# Patient Record
Sex: Female | Born: 1965 | Race: White | Hispanic: Yes | Marital: Married | Smoking: Never smoker
Health system: Southern US, Community
[De-identification: ages and names within clinical notes are randomized; demographics above are authoritative.]

---

## 2001-05-29 HISTORY — PX: ABDOMINAL HYSTERECTOMY: SHX81

## 2004-12-29 ENCOUNTER — Ambulatory Visit: Payer: Self-pay | Admitting: Family Medicine

## 2005-01-03 ENCOUNTER — Ambulatory Visit: Payer: Self-pay | Admitting: Obstetrics and Gynecology

## 2005-03-08 ENCOUNTER — Ambulatory Visit: Payer: Self-pay | Admitting: Unknown Physician Specialty

## 2005-03-14 ENCOUNTER — Ambulatory Visit: Payer: Self-pay | Admitting: Unknown Physician Specialty

## 2006-01-04 ENCOUNTER — Ambulatory Visit: Payer: Self-pay | Admitting: Obstetrics and Gynecology

## 2006-08-06 ENCOUNTER — Ambulatory Visit: Payer: Self-pay | Admitting: Gastroenterology

## 2006-10-25 ENCOUNTER — Ambulatory Visit: Payer: Self-pay | Admitting: Gastroenterology

## 2007-01-07 ENCOUNTER — Ambulatory Visit: Payer: Self-pay | Admitting: Obstetrics and Gynecology

## 2007-07-12 ENCOUNTER — Ambulatory Visit: Payer: Self-pay | Admitting: Obstetrics and Gynecology

## 2007-07-19 ENCOUNTER — Ambulatory Visit: Payer: Self-pay | Admitting: Obstetrics and Gynecology

## 2008-01-14 ENCOUNTER — Ambulatory Visit: Payer: Self-pay | Admitting: Obstetrics and Gynecology

## 2008-01-23 ENCOUNTER — Ambulatory Visit: Payer: Self-pay | Admitting: Gastroenterology

## 2008-05-29 HISTORY — PX: CHOLECYSTECTOMY: SHX55

## 2009-02-04 ENCOUNTER — Ambulatory Visit: Payer: Self-pay | Admitting: Gastroenterology

## 2009-02-04 LAB — HM COLONOSCOPY

## 2009-02-05 ENCOUNTER — Ambulatory Visit: Payer: Self-pay | Admitting: Surgery

## 2009-02-07 IMAGING — NM NUCLEAR MEDICINE HEPATOHBILIARY INCLUDE GB
1 series · 7 of 7 positions shown · non-contrast
Comparison: none

REASON FOR EXAM: abdominal pain   eval gallstones
COMMENTS:

[Series 1000: gallbladder statics · 4.80mm/px · 7 of 7 slices shown]
[im 1/7]
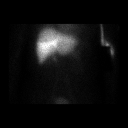
[im 2/7]
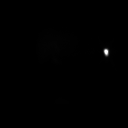
[im 3/7]
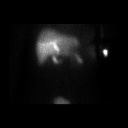
[im 4/7]
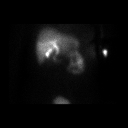
[im 5/7]
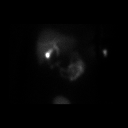
[im 6/7]
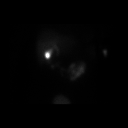
[im 7/7]
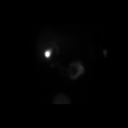

[7 of 7 positions shown; findings below may reference images not displayed]

PROCEDURE:     NM  - NM HEPATOBILIARY IMAGE  - January 23, 2008  [DATE]

RESULT:     Following intravenous administration of 8.43 mCi Tc 99m
Choletec, there is noted prompt visualization of tracer activity in the
liver at 10 minutes. Tracer activity is visualized in the gallbladder,
common duct and proximal small bowel at 30 minutes.
IMPRESSION: 1.     Normal hepatobiliary scan.

## 2009-02-18 ENCOUNTER — Ambulatory Visit: Payer: Self-pay | Admitting: Surgery

## 2009-03-03 ENCOUNTER — Ambulatory Visit: Payer: Self-pay | Admitting: Surgery

## 2009-03-09 ENCOUNTER — Ambulatory Visit: Payer: Self-pay | Admitting: Obstetrics and Gynecology

## 2010-03-16 ENCOUNTER — Ambulatory Visit: Payer: Self-pay | Admitting: Obstetrics and Gynecology

## 2010-07-04 ENCOUNTER — Ambulatory Visit: Payer: Self-pay | Admitting: Family Medicine

## 2011-12-18 IMAGING — MG MAM DGTL SCREENING MAMMO W/CAD
1 series · 4 of 4 positions shown · non-contrast
Comparison: 03/09/2009 and 01/14/2008.

REASON FOR EXAM: SCR
COMMENTS:

PROCEDURE:     MAM - MAM DGTL SCREENING MAMMO W/CAD  - March 16, 2010  [DATE]
RESULT:

[Series 5507: R CC · right · 4 of 4 slices shown]
[im 1/4]
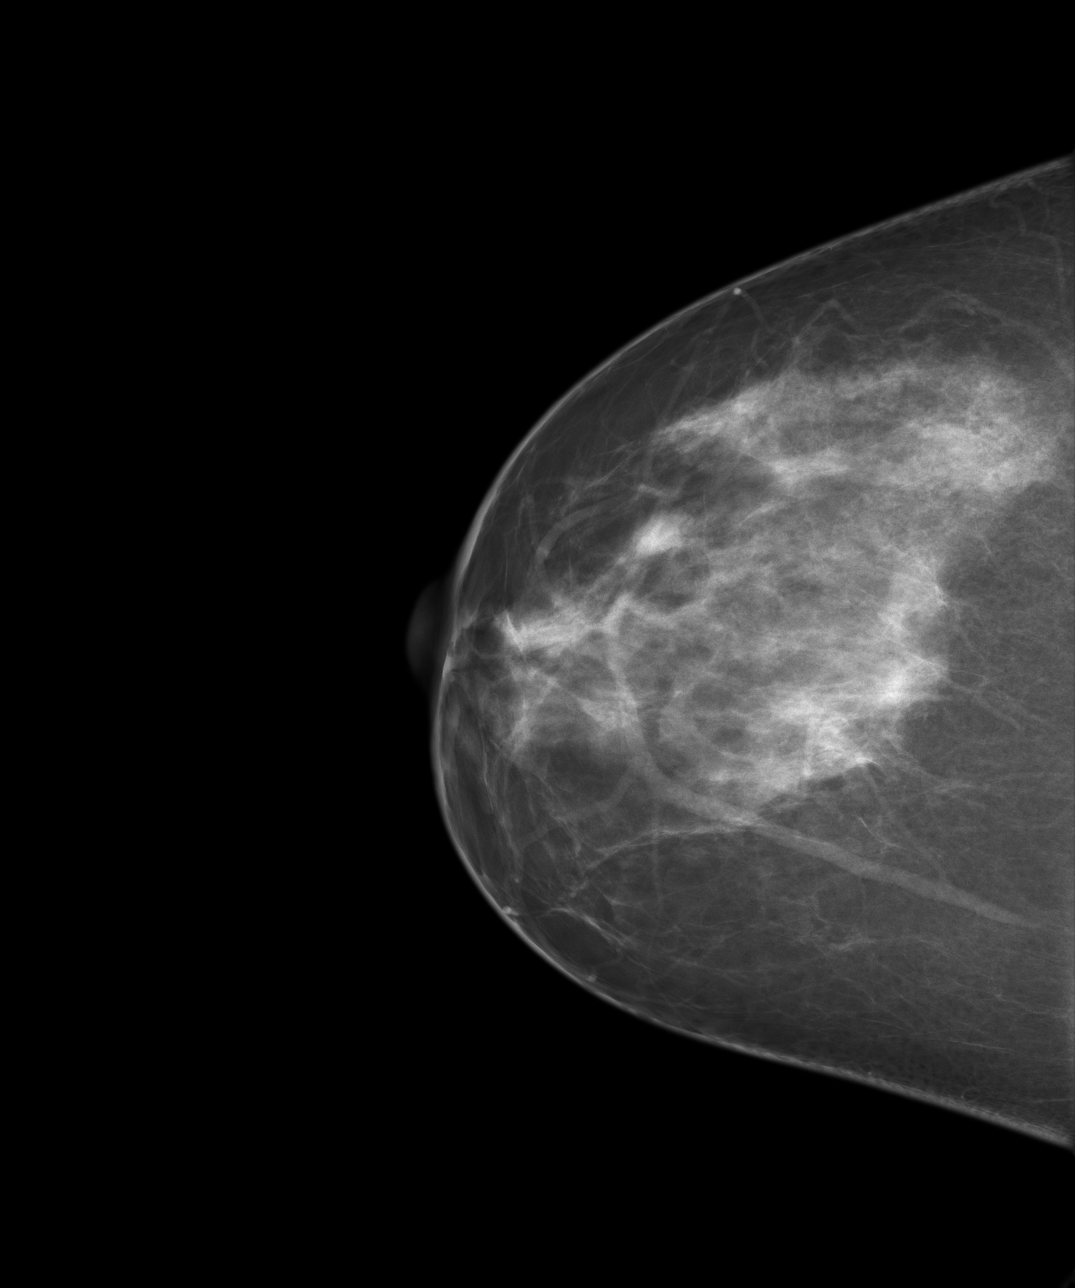
[im 2/4]
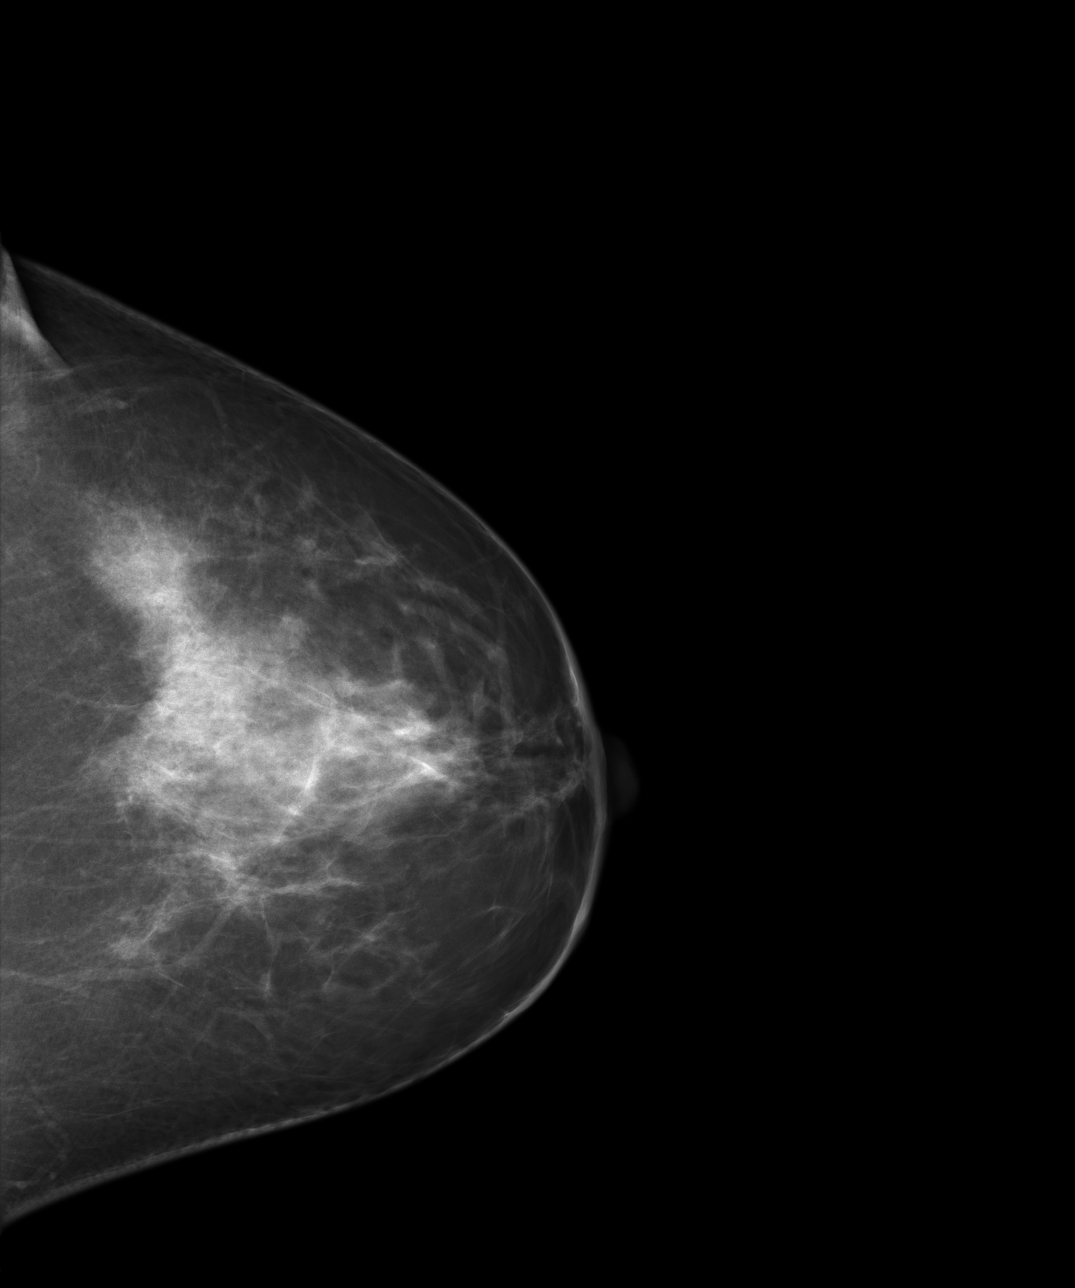
[im 3/4]
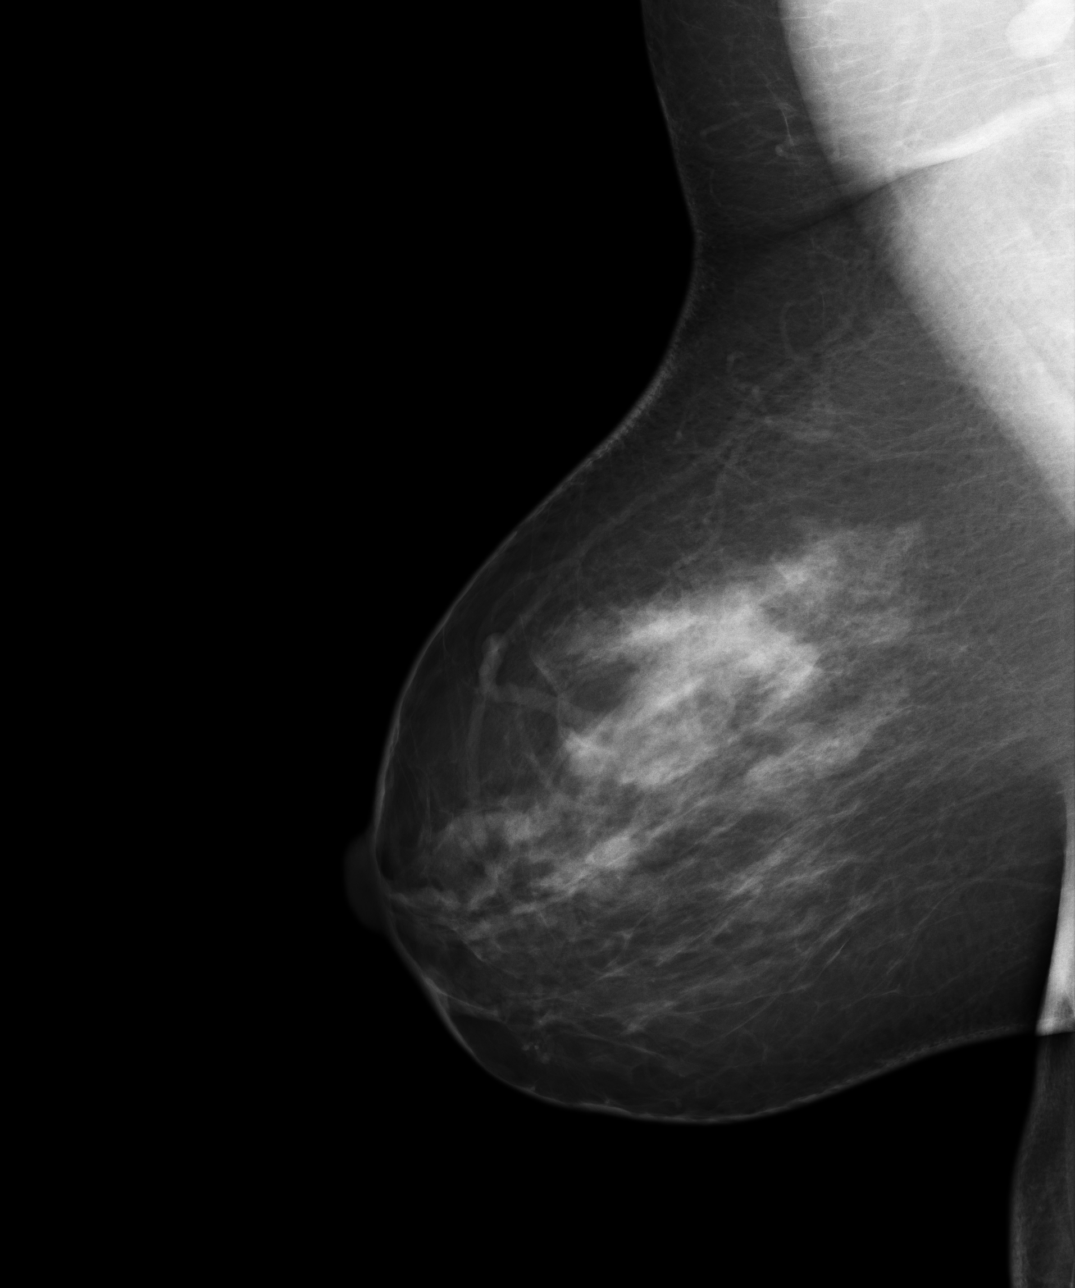
[im 4/4]
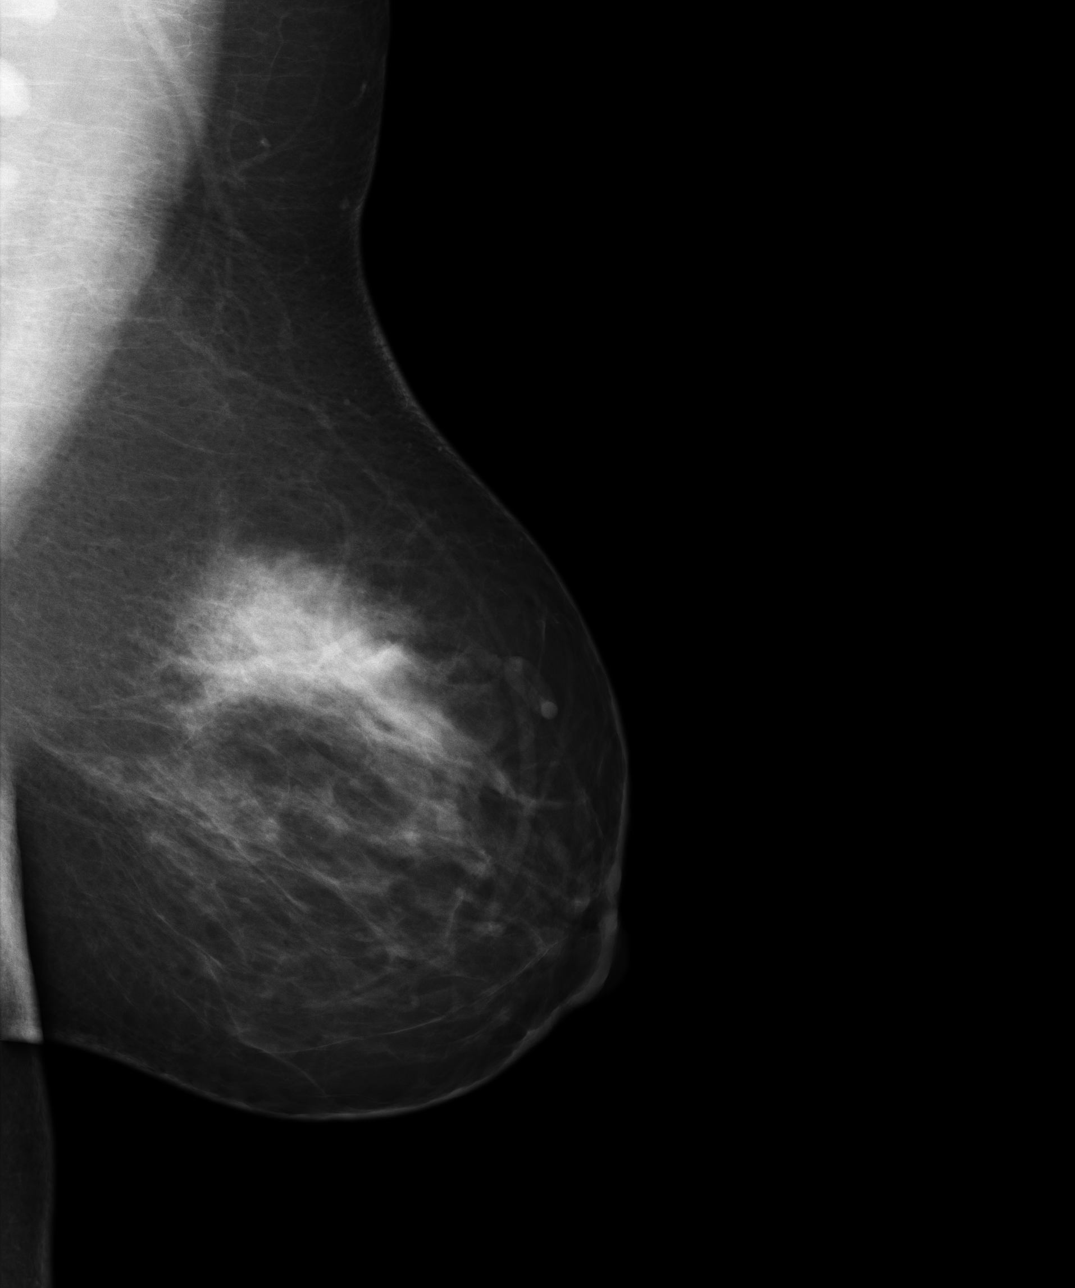

[4 of 4 positions shown; findings below may reference images not displayed]

FINDINGS: The breast tissue is heterogenously dense.  No new or suspicious
masses or calcifications are identified.   No areas of architectural
distortion.
IMPRESSION: 1.     BI-RADS: Category 1-Negative.
2.     Continued annual screening mammography is recommended.

A negative mammogram report does not preclude biopsy or other evaluation of
a clinically palpable or otherwise suspicious mass or lesion. Breast cancer
may not be detected by mammography in up to 10% of cases.

## 2012-10-25 ENCOUNTER — Ambulatory Visit: Payer: Self-pay | Admitting: Obstetrics and Gynecology

## 2014-07-29 IMAGING — MG MAM DGTL SCRN MAM NO ORDER W/CAD
1 series · 4 of 4 positions shown · non-contrast
Comparison: none

REASON FOR EXAM: SCR MAMMO NO ORDER
COMMENTS:

PROCEDURE:     MAM - MAM DGTL SCRN MAM NO ORDER W/CAD  - October 25, 2012  [DATE]
RESULT:
Comparisons: 03/16/2010, 03/09/2009, and 01/14/2008.

[R CC · right · 4 of 4 slices shown]
[im 1/4]
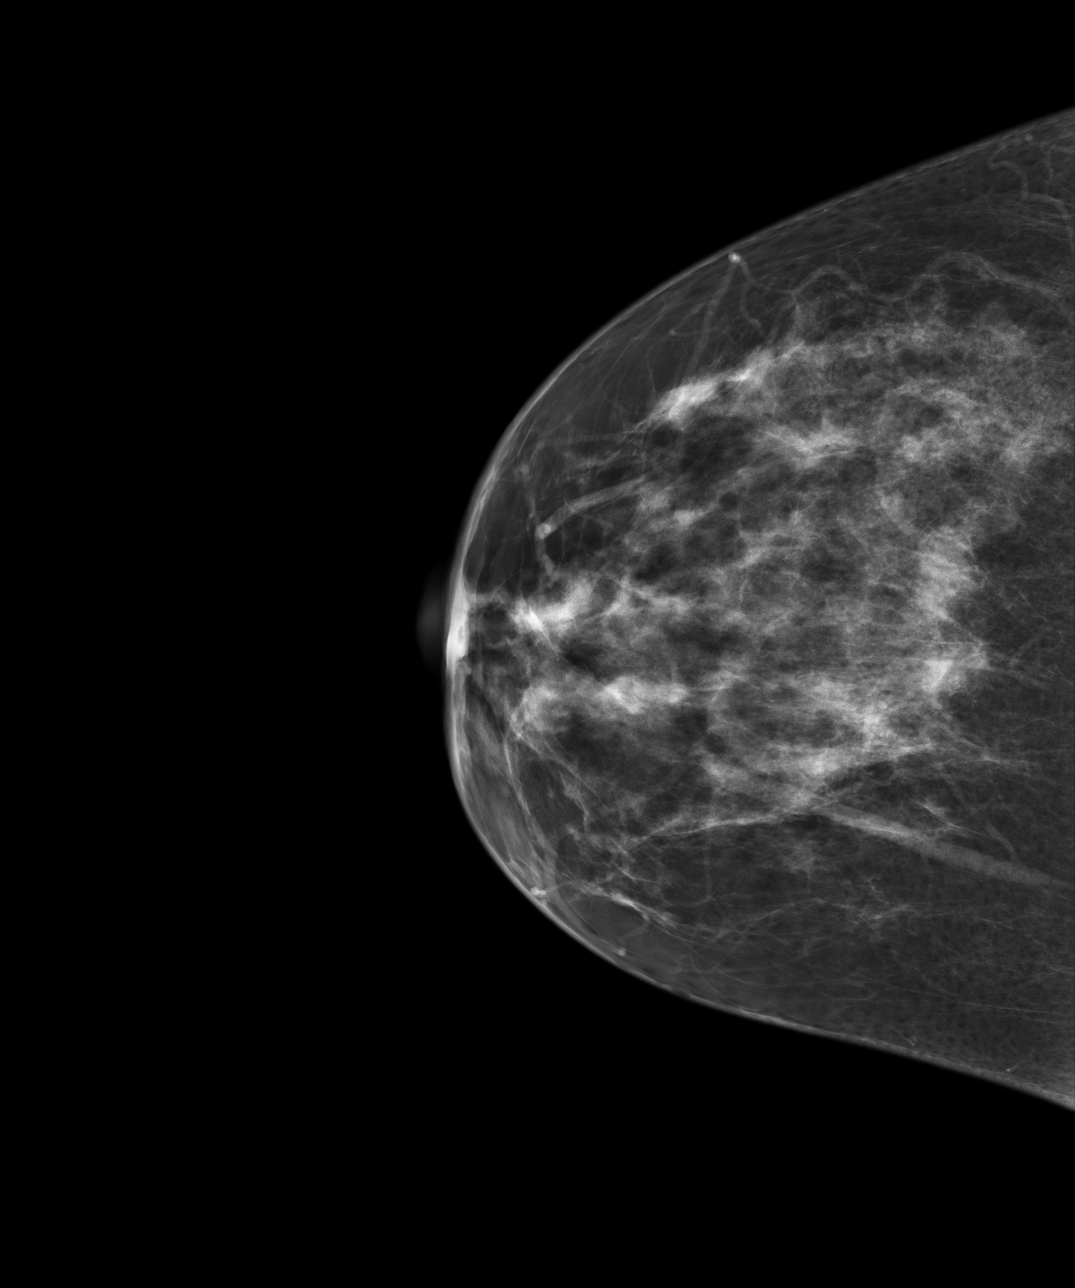
[im 2/4]
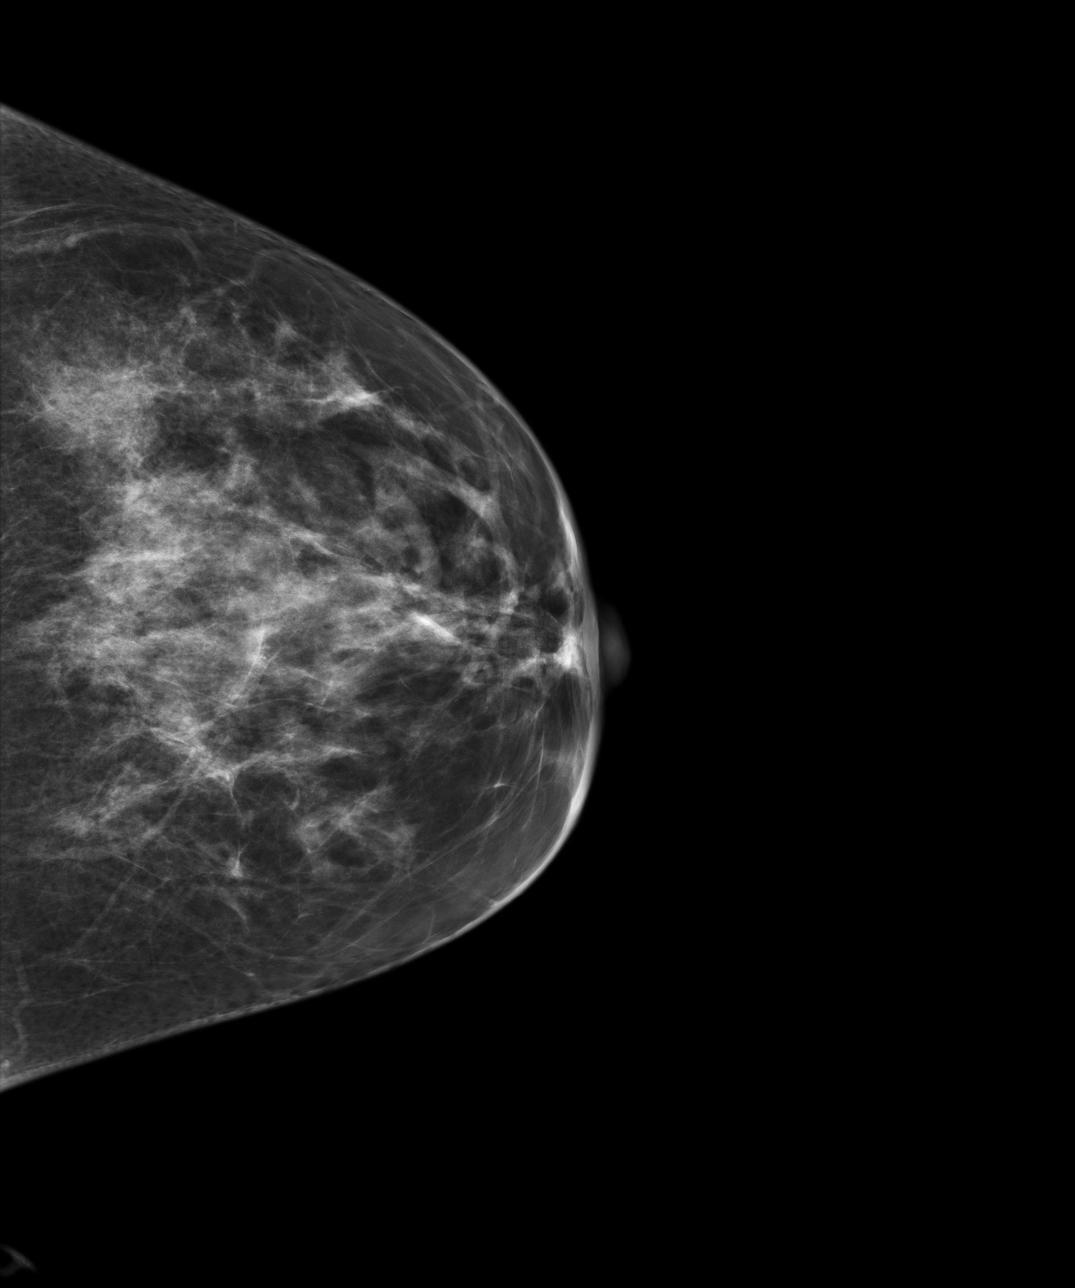
[im 3/4]
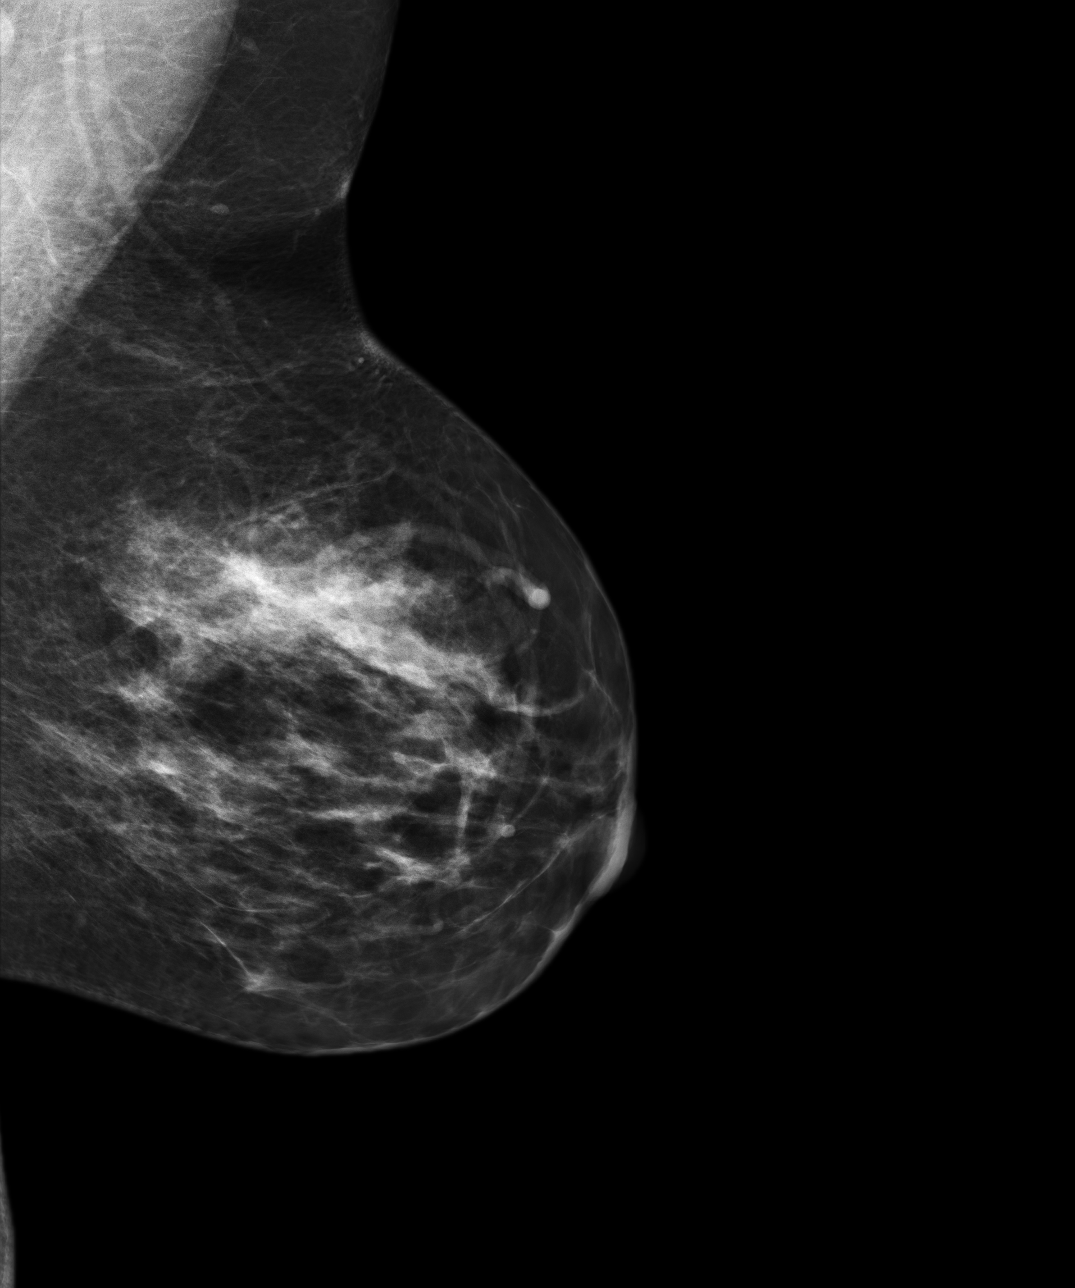
[im 4/4]
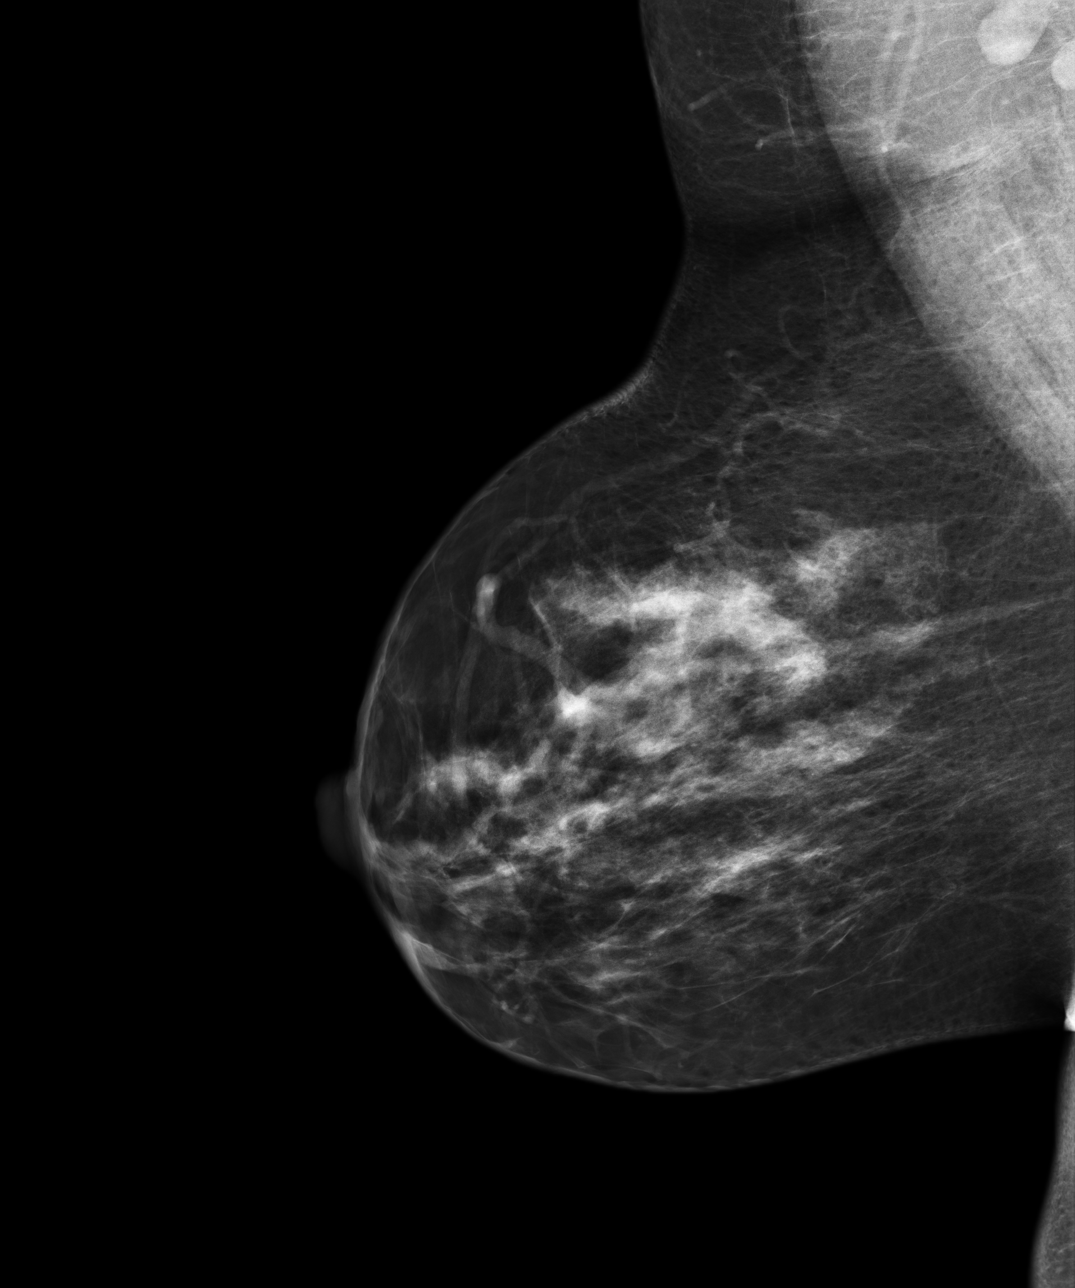

[4 of 4 positions shown; findings below may reference images not displayed]

FINDINGS: The breast tissue is heterogeneously dense, which may lower the sensitivity
of mammography. No suspicious masses or calcifications are identified. No
areas of architectural distortion.
IMPRESSION: BI-RADS: Category 1  Negative

BREAST COMPOSITION: The breast composition is HETEROGENEOUSLY DENSE
(glandular tissue is 51-75%) This may decrease the sensitivity of
mammography.

Recommend continued annual screening mammography.

A NEGATIVE MAMMOGRAM REPORT DOES NOT PRECLUDE BIOPSY OR OTHER EVALUATION OF
A CLINICALLY PALPABLE OR OTHERWISE SUSPICIOUS MASS OR LESION. BREAST CANCER
MAY NOT BE DETECTED BY MAMMOGRAPHY IN UP TO 10% OF CASES.

[REDACTED]

## 2015-07-27 ENCOUNTER — Other Ambulatory Visit: Payer: Self-pay | Admitting: Family Medicine

## 2015-07-27 DIAGNOSIS — F419 Anxiety disorder, unspecified: Secondary | ICD-10-CM

## 2015-07-27 NOTE — Telephone Encounter (Signed)
Last OV 09/2014  Thanks,   -Laura  

## 2015-07-27 NOTE — Telephone Encounter (Signed)
Printed, please fax or call in to pharmacy. Thank you.   

## 2015-07-30 ENCOUNTER — Other Ambulatory Visit: Payer: Self-pay | Admitting: Family Medicine

## 2015-07-30 DIAGNOSIS — F419 Anxiety disorder, unspecified: Secondary | ICD-10-CM

## 2015-09-15 ENCOUNTER — Ambulatory Visit (INDEPENDENT_AMBULATORY_CARE_PROVIDER_SITE_OTHER): Payer: Self-pay | Admitting: Family Medicine

## 2015-09-15 ENCOUNTER — Encounter: Payer: Self-pay | Admitting: Family Medicine

## 2015-09-15 VITALS — BP 126/82 | HR 84 | Temp 98.5°F | Resp 16 | Wt 194.0 lb

## 2015-09-15 DIAGNOSIS — K589 Irritable bowel syndrome without diarrhea: Secondary | ICD-10-CM | POA: Insufficient documentation

## 2015-09-15 DIAGNOSIS — I10 Essential (primary) hypertension: Secondary | ICD-10-CM | POA: Insufficient documentation

## 2015-09-15 DIAGNOSIS — J309 Allergic rhinitis, unspecified: Secondary | ICD-10-CM

## 2015-09-15 DIAGNOSIS — F32A Depression, unspecified: Secondary | ICD-10-CM | POA: Insufficient documentation

## 2015-09-15 DIAGNOSIS — F329 Major depressive disorder, single episode, unspecified: Secondary | ICD-10-CM | POA: Insufficient documentation

## 2015-09-15 DIAGNOSIS — K649 Unspecified hemorrhoids: Secondary | ICD-10-CM | POA: Insufficient documentation

## 2015-09-15 DIAGNOSIS — K2970 Gastritis, unspecified, without bleeding: Secondary | ICD-10-CM | POA: Insufficient documentation

## 2015-09-15 DIAGNOSIS — R05 Cough: Secondary | ICD-10-CM

## 2015-09-15 DIAGNOSIS — G4730 Sleep apnea, unspecified: Secondary | ICD-10-CM | POA: Insufficient documentation

## 2015-09-15 DIAGNOSIS — R609 Edema, unspecified: Secondary | ICD-10-CM | POA: Insufficient documentation

## 2015-09-15 DIAGNOSIS — K449 Diaphragmatic hernia without obstruction or gangrene: Secondary | ICD-10-CM | POA: Insufficient documentation

## 2015-09-15 DIAGNOSIS — R059 Cough, unspecified: Secondary | ICD-10-CM

## 2015-09-15 DIAGNOSIS — M5430 Sciatica, unspecified side: Secondary | ICD-10-CM | POA: Insufficient documentation

## 2015-09-15 DIAGNOSIS — L309 Dermatitis, unspecified: Secondary | ICD-10-CM | POA: Insufficient documentation

## 2015-09-15 DIAGNOSIS — K635 Polyp of colon: Secondary | ICD-10-CM | POA: Insufficient documentation

## 2015-09-15 DIAGNOSIS — R0602 Shortness of breath: Secondary | ICD-10-CM | POA: Insufficient documentation

## 2015-09-15 DIAGNOSIS — E559 Vitamin D deficiency, unspecified: Secondary | ICD-10-CM | POA: Insufficient documentation

## 2015-09-15 DIAGNOSIS — F419 Anxiety disorder, unspecified: Secondary | ICD-10-CM

## 2015-09-15 MED ORDER — ALBUTEROL SULFATE HFA 108 (90 BASE) MCG/ACT IN AERS
2.0000 | INHALATION_SPRAY | Freq: Four times a day (QID) | RESPIRATORY_TRACT | Status: AC | PRN
Start: 2015-09-15 — End: ?

## 2015-09-15 MED ORDER — ALPRAZOLAM 0.5 MG PO TABS: 0.5000 mg | ORAL_TABLET | Freq: Every day | ORAL | Status: DC | PRN

## 2015-09-15 MED ORDER — LEVALBUTEROL HCL 1.25 MG/0.5ML IN NEBU
1.2500 mg | INHALATION_SOLUTION | Freq: Once | RESPIRATORY_TRACT | Status: AC
  Administered 2015-09-15: 1.25 mg via RESPIRATORY_TRACT

## 2015-09-15 MED ORDER — MONTELUKAST SODIUM 10 MG PO TABS: 10.0000 mg | ORAL_TABLET | Freq: Every day | ORAL | Status: AC

## 2015-09-15 NOTE — Progress Notes (Signed)
Subjective:    Patient ID: Adrienne Edwards, female    DOB: [birth year], [age] y.o.   MRN: [id number]  Cough The current episode started more than 1 year ago. The problem has been gradually worsening. The cough is non-productive. Associated symptoms include chest pain, nasal congestion, shortness of breath and wheezing. Pertinent negatives include no chills, ear congestion, ear pain, fever, headaches, heartburn, hemoptysis, myalgias, postnasal drip, rhinorrhea or sore throat. The symptoms are aggravated by dust, pollens and animals (pt reports she was in [place] for a month, without breathing/coughing issues. Pt notices when she comes back here, her sx worsen). Treatments tried: Nasacort, Zyrtec. The treatment provided moderate relief. Her past medical history is significant for environmental allergies.      Review of Systems  Constitutional: Negative for fever and chills.  HENT: Negative for ear pain, postnasal drip, rhinorrhea and sore throat.   Respiratory: Positive for cough, shortness of breath and wheezing. Negative for hemoptysis.   Cardiovascular: Positive for chest pain.  Gastrointestinal: Negative for heartburn.  Musculoskeletal: Negative for myalgias.  Allergic/Immunologic: Positive for environmental allergies.  Neurological: Negative for headaches.   BP 126/82 mmHg  Pulse 84  Temp(Src) 98.5 F (36.9 C) (Oral)  Resp 16  Wt 194 lb (87.998 kg)  SpO2 98%   Patient Active Problem List   Diagnosis Date Noted  . Allergic rhinitis 09/15/2015  . Colon polyp 09/15/2015  . Clinical depression 09/15/2015  . Dermatitis, eczematoid 09/15/2015  . Accumulation of fluid in tissues 09/15/2015  . Gastric catarrh 09/15/2015  . Hemorrhoid 09/15/2015  . 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 Benign hypertension 09/15/2015  . 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 Neuralgia neuritis, sciatic nerve 09/15/2015  . Avitaminosis D 09/15/2015  . Apnea, sleep 09/15/2015  . Breath shortness 09/15/2015   . Anxiety 07/27/2015  . Calculus of gallbladder with cholecystitis and obstruction 01/21/2009   No past medical history on file. Current Outpatient Prescriptions on File Prior to Visit  Medication Sig  . ALPRAZolam (XANAX) 0.5 MG tablet TAKE 1 TABLET BY MOUTH DAILY (Patient not taking: Reported on 09/15/2015)   No current facility-administered medications on file prior to visit.   Allergies  Allergen Reactions  . Codeine   . Hydrocodone-Acetaminophen   . Naproxen    Past Surgical History  Procedure Laterality Date  . Cholecystectomy  2010  . Abdominal hysterectomy  2003   Social History   Social History  . Marital Status: Married    Spouse Name: N/A  . Number of Children: N/A  . Years of Education: N/A   Occupational History  . Not on file.   Social History Main Topics  . Smoking status: Never Smoker   . Smokeless tobacco: Never Used  . Alcohol Use: Yes     Comment: socially  . Drug Use: No  . Sexual Activity: Not on file   Other Topics Concern  . Not on file   Social History Narrative   Family History  Problem Relation Age of Onset  . Heart disease Mother   . Cancer Father     prostate       Objective:   Physical Exam  Constitutional: She appears well-developed and well-nourished.  HENT:  Head: Normocephalic and atraumatic.  Right Ear: External ear normal.  Left Ear: External ear normal.  Nose: Nose normal.  Mouth/Throat: No oropharyngeal exudate.  Cardiovascular: Normal rate, regular rhythm and normal heart sounds.   Pulmonary/Chest: Effort normal and breath sounds normal. No respiratory distress.  Psychiatric:  She has a normal mood and affect. Her behavior is normal.  BP 126/82 mmHg  Pulse 84  Temp(Src) 98.5 F (36.9 C) (Oral)  Resp 16  Wt 194 lb (87.998 kg)  SpO2 98%     Assessment & Plan:  1. Cough Consider AR vs. Asthma. Will start ProAir as below. FU 2 weeks. If using ProAir frequently, may consider asthma as diagnosis. -  levalbuterol (XOPENEX) nebulizer solution 1.25 mg; Take 1.25 mg by nebulization once. - albuterol (PROVENTIL HFA;VENTOLIN HFA) 108 (90 Base) MCG/ACT inhaler; Inhale 2 puffs into the lungs every 6 (six) hours as needed for wheezing or shortness of breath.  Dispense: 1 Inhaler; Refill: 3  2. Allergic rhinitis, unspecified allergic rhinitis type Worsening. Continue Zyrtec and Nasacort. Will add Singulair and ProAir as below. FU 2 weeks. - montelukast (SINGULAIR) 10 MG tablet; Take 1 tablet (10 mg total) by mouth at bedtime.  Dispense: 30 tablet; Refill: 11 - albuterol (PROVENTIL HFA;VENTOLIN HFA) 108 (90 Base) MCG/ACT inhaler; Inhale 2 puffs into the lungs every 6 (six) hours as needed for wheezing or shortness of breath.  Dispense: 1 Inhaler; Refill: 3  3. Anxiety Pt needs refill. Printed. - ALPRAZolam (XANAX) 0.5 MG tablet; Take 1 tablet (0.5 mg total) by mouth daily as needed for anxiety.  Dispense: 30 tablet; Refill: 5   Patient seen and examined by Leo Grosser, MD, and note scribed by Allene Dillon, CMA.  I have reviewed the document for accuracy and completeness and I agree with above. Leo Grosser, MD   Lorie Phenix, MD

## 2015-09-29 ENCOUNTER — Ambulatory Visit (INDEPENDENT_AMBULATORY_CARE_PROVIDER_SITE_OTHER): Payer: Self-pay | Admitting: Family Medicine

## 2015-09-29 ENCOUNTER — Encounter: Payer: Self-pay | Admitting: Family Medicine

## 2015-09-29 VITALS — BP 118/78 | HR 82 | Temp 98.1°F | Resp 16 | Wt 193.0 lb

## 2015-09-29 DIAGNOSIS — M722 Plantar fascial fibromatosis: Secondary | ICD-10-CM

## 2015-09-29 DIAGNOSIS — J4520 Mild intermittent asthma, uncomplicated: Secondary | ICD-10-CM

## 2015-09-29 MED ORDER — FLUTICASONE-SALMETEROL 250-50 MCG/DOSE IN AEPB
1.0000 | INHALATION_SPRAY | Freq: Two times a day (BID) | RESPIRATORY_TRACT | Status: AC
Start: 2015-09-29 — End: ?

## 2015-09-29 NOTE — Patient Instructions (Signed)

## 2015-09-29 NOTE — Progress Notes (Signed)
Subjective:    Patient ID: Adrienne Edwards, female    DOB: 1965-07-14, 50 y.o.   MRN: [id number]  Cough Chronicity: FU from 09/15/2015. PCP was considering AR Vs asthma. Added ProAir and Singulair at LOV. The problem has been gradually improving (pt states she is 70% improved. Has not had to use ProAir this week. Pt reports she notices the cough when she gets "upset"). The cough is non-productive. Associated symptoms include chest pain ("pressure"), headaches and wheezing. Pertinent negatives include no chills, ear congestion, ear pain, fever, heartburn, hemoptysis, nasal congestion, postnasal drip, rhinorrhea, sore throat, shortness of breath or weight loss. She has tried a beta-agonist inhaler and leukotriene antagonists for the symptoms. The treatment provided moderate relief. Her past medical history is significant for environmental allergies.    Foot Pain  Adrienne Edwards is a 50 y.o. female who presents with a bilateral foot pain that has been present since month(s) ago. Pt describes the pain as aching and slightly burning. Denies numbness or tingling. Symptoms have been periodic since that time. "I have the pain 95% of the time." Prior history of related problems: no prior problems with this area in the past. Pt does notice the pain is worse in the morning.    Review of Systems  Constitutional: Negative for fever, chills and weight loss.  HENT: Negative for ear pain, postnasal drip, rhinorrhea and sore throat.   Respiratory: Positive for cough and wheezing. Negative for hemoptysis and shortness of breath.   Cardiovascular: Positive for chest pain ("pressure").  Gastrointestinal: Negative for heartburn.  Allergic/Immunologic: Positive for environmental allergies.  Neurological: Positive for headaches.   BP 118/78 mmHg  Pulse 82  Temp(Src) 98.1 F (36.7 C) (Oral)  Resp 16  Wt 193 lb (87.544 kg)  SpO2 98%   Patient Active Problem List   Diagnosis Date Noted  . Allergic  rhinitis 09/15/2015  . Colon polyp 09/15/2015  . Clinical depression 09/15/2015  . Dermatitis, eczematoid 09/15/2015  . Accumulation of fluid in tissues 09/15/2015  . Gastric catarrh 09/15/2015  . Hemorrhoid 09/15/2015  . 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 Benign hypertension 09/15/2015  . 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 Neuralgia neuritis, sciatic nerve 09/15/2015  . Avitaminosis D 09/15/2015  . Apnea, sleep 09/15/2015  . Breath shortness 09/15/2015  . Anxiety 07/27/2015  . Calculus of gallbladder with cholecystitis and obstruction 01/21/2009   No past medical history on file. Current Outpatient Prescriptions on File Prior to Visit  Medication Sig  . albuterol (PROVENTIL HFA;VENTOLIN HFA) 108 (90 Base) MCG/ACT inhaler Inhale 2 puffs into the lungs every 6 (six) hours as needed for wheezing or shortness of breath.  . ALPRAZolam (XANAX) 0.5 MG tablet Take 1 tablet (0.5 mg total) by mouth daily as needed for anxiety.  . cetirizine (ZYRTEC) 10 MG tablet Take 10 mg by mouth daily.  . hydrochlorothiazide (HYDRODIURIL) 12.5 MG tablet Take by mouth.  . montelukast (SINGULAIR) 10 MG tablet Take 1 tablet (10 mg total) by mouth at bedtime.  . Triamcinolone Acetonide (NASACORT ALLERGY 24HR NA) Place into the nose.   No current facility-administered medications on file prior to visit.   Allergies  Allergen Reactions  . Codeine   . Hydrocodone-Acetaminophen   . Naproxen    Past Surgical History  Procedure Laterality Date  . Cholecystectomy  2010  . Abdominal hysterectomy  2003   Social History   Social History  . Marital Status: Married    Spouse Name: N/A  . Number of Children:  N/A  . Years of Education: N/A   Occupational History  . Not on file.   Social History Main Topics  . Smoking status: Never Smoker   . Smokeless tobacco: Never Used  . Alcohol Use: Yes     Comment: socially  . Drug Use: No  . Sexual Activity: Not on file   Other Topics Concern  . Not on file    Social History Narrative   Family History  Problem Relation Age of Onset  . Heart disease Mother   . Cancer Father     prostate       Objective:   Physical Exam  Constitutional: She appears well-developed and well-nourished.  HENT:  Right Ear: Tympanic membrane normal.  Left Ear: Tympanic membrane normal.  Nose: Nose normal.  Mouth/Throat: Oropharynx is clear and moist.  Cardiovascular: Normal rate, regular rhythm and normal heart sounds.   Pulmonary/Chest: Effort normal and breath sounds normal. No respiratory distress. She has no wheezes.  Psychiatric: She has a normal mood and affect. Her behavior is normal.   BP 118/78 mmHg  Pulse 82  Temp(Src) 98.1 F (36.7 C) (Oral)  Resp 16  Wt 193 lb (87.544 kg)  SpO2 98%     Assessment & Plan:  1. Asthma, mild intermittent, uncomplicated Will start Advair as below. #4 samples given. FU 4 weeks and repeat spirometry at that time. - Spirometry with Graph - Fluticasone-Salmeterol (ADVAIR DISKUS) 250-50 MCG/DOSE AEPB; Inhale 1 puff into the lungs 2 (two) times daily.  Dispense: 1 each  2. Plantar fasciitis New problem Recommended ice with water bottle and printed off exercises for plantar fasciitis. If exercises do not improve sx, will refer to podiatry. Naproxen causes angioedema, therefore will defer Meloxicam prescription.   Patient seen and examined by Leo GrosserNancy J. Jeimy Bickert, MD, and note scribed by Allene DillonEmily Drozdowski, CMA.  I have reviewed the document for accuracy and completeness and I agree with above. Leo Grosser- Dejour Vos J. Zohar Maroney, MD   Lorie PhenixNancy Dewane Timson, MD

## 2015-09-30 ENCOUNTER — Telehealth: Payer: Self-pay | Admitting: Family Medicine

## 2015-09-30 NOTE — Telephone Encounter (Signed)
Per patient's husband patient has never taken Diclofenac before. Husband states he has taken it before and that's why patient wanted to try it. Please advise if there is a different alternative. Patient's CB# [phone].

## 2015-09-30 NOTE — Telephone Encounter (Signed)
Please clarify if can take Diclofenac. In same family. Check with patient. Thanks.

## 2015-09-30 NOTE — Telephone Encounter (Signed)
She can try it and see. May have similar reaction.  Had to say. Has she taking Ibuprofen without a reaction.  Only non-NSAID would be Tylenol. Thanks.

## 2015-09-30 NOTE — Telephone Encounter (Signed)
Pt;s husband called asking for medication diclofenac 75mg  for her planter fasciatus that she was recently diagnosed with. He said she is allergic to aleve.     His call back is [phone]  Thanks, Barth Kirksteri

## 2015-10-01 MED ORDER — DICLOFENAC POTASSIUM 50 MG PO TABS: 50.0000 mg | ORAL_TABLET | Freq: Three times a day (TID) | ORAL | Status: AC

## 2015-10-01 NOTE — Telephone Encounter (Signed)
Pt hesitant to try Tylenol due to medication affecting the liver. Would like to try to Diclofenac for now. Advised pt to wait until husband is home to try this, and have Benadryl on hand, as well as go to ED if reaction is severe. [facility] Seibert Keeter Drozdowski, CMA

## 2015-10-27 ENCOUNTER — Encounter: Payer: Self-pay | Admitting: Family Medicine

## 2015-10-27 ENCOUNTER — Ambulatory Visit (INDEPENDENT_AMBULATORY_CARE_PROVIDER_SITE_OTHER): Payer: Self-pay | Admitting: Family Medicine

## 2015-10-27 VITALS — BP 120/84 | HR 80 | Temp 97.4°F | Resp 16 | Wt 197.0 lb

## 2015-10-27 DIAGNOSIS — Z1211 Encounter for screening for malignant neoplasm of colon: Secondary | ICD-10-CM

## 2015-10-27 DIAGNOSIS — J4520 Mild intermittent asthma, uncomplicated: Secondary | ICD-10-CM

## 2015-10-27 DIAGNOSIS — M722 Plantar fascial fibromatosis: Secondary | ICD-10-CM

## 2015-10-27 NOTE — Progress Notes (Signed)
Patient ID: Adrienne ModeSylvia J Vidaurri, female   DOB: April 15, 1966, 50 y.o.   MRN: [id number]       Patient: Adrienne ModeSylvia J Buswell Female    DOB: April 15, 1966   50 y.o.   MRN: [id number] Visit Date: 10/27/2015  Today's Provider: Lorie PhenixNancy Jovan Schickling, MD   Chief Complaint  Patient presents with  . Asthma  . Foot Pain   Subjective:    HPI  Follow up for asthma  The patient was last seen for this 4 weeks ago. Changes made at last visit include starting Advair.  She reports excellent compliance with treatment. She feels that condition is Improved. She is not having side effects.  ------------------------------------------------------------------------------------   Follow up for bilateral foot pain  The patient was last seen for this 4 weeks ago. Changes made at last visit include starting Diclofenac.  She reports excellent compliance with treatment. She feels that condition is Improved. She is not having side effects.  ------------------------------------------------------------------------------------        Allergies  Allergen Reactions  . Codeine   . Hydrocodone-Acetaminophen   . Naproxen    Previous Medications   ALBUTEROL (PROVENTIL HFA;VENTOLIN HFA) 108 (90 BASE) MCG/ACT INHALER    Inhale 2 puffs into the lungs every 6 (six) hours as needed for wheezing or shortness of breath.   ALPRAZOLAM (XANAX) 0.5 MG TABLET    Take 1 tablet (0.5 mg total) by mouth daily as needed for anxiety.   CETIRIZINE (ZYRTEC) 10 MG TABLET    Take 10 mg by mouth daily.   DICLOFENAC (CATAFLAM) 50 MG TABLET    Take 1 tablet (50 mg total) by mouth 3 (three) times daily.   FLUTICASONE-SALMETEROL (ADVAIR DISKUS) 250-50 MCG/DOSE AEPB    Inhale 1 puff into the lungs 2 (two) times daily.   HYDROCHLOROTHIAZIDE (HYDRODIURIL) 12.5 MG TABLET    Take 12.5 mg by mouth daily.    MONTELUKAST (SINGULAIR) 10 MG TABLET    Take 1 tablet (10 mg total) by mouth at bedtime.   TRIAMCINOLONE ACETONIDE (NASACORT ALLERGY 24HR NA)     Place into the nose as needed. Reported on 10/27/2015    Review of Systems  Constitutional: Negative.   Respiratory: Negative.   Musculoskeletal: Negative.     Social History  Substance Use Topics  . Smoking status: Never Smoker   . Smokeless tobacco: Never Used  . Alcohol Use: No     Comment: socially   Objective:   BP 120/84 mmHg  Pulse 80  Temp(Src) 97.4 F (36.3 C) (Oral)  Resp 16  Wt 197 lb (89.359 kg)  SpO2 97%  Physical Exam  Constitutional: She is oriented to person, place, and time. She appears well-developed and well-nourished.  Cardiovascular: Normal rate, regular rhythm and normal heart sounds.   Pulmonary/Chest: Effort normal and breath sounds normal.  Neurological: She is alert and oriented to person, place, and time.      Assessment & Plan:     1. Asthma, mild intermittent, uncomplicated Stable. Improved. Patient advised to continue current medication and plan of care.  2. Plantar fasciitis Stable. Improved. Patient advised to continue current medication and plan of care.  3. Colon cancer screening Patient declined colonoscopy due to no insurance. Patient advised to have colonoscopy done soon due to history of polyp in 02/04/2009.      Patient seen and examined by Dr. Leo GrosserNancy J.. Marysue Fait, and note scribed by Liz BeachSulibeya S. Dimas, CMA.  I have reviewed the document for accuracy and completeness and I agree with  above. - Leo Grosser, MD   Lorie Phenix, MD  [facility]

## 2015-10-27 NOTE — Patient Instructions (Signed)
Please call the [facility] at [facility] to schedule this at [phone]   

## 2016-01-03 ENCOUNTER — Telehealth: Payer: Self-pay | Admitting: Family Medicine

## 2016-01-03 DIAGNOSIS — F419 Anxiety disorder, unspecified: Secondary | ICD-10-CM

## 2016-01-03 MED ORDER — ALPRAZOLAM 0.5 MG PO TABS: 0.5000 mg | ORAL_TABLET | Freq: Every day | ORAL | 1 refills | Status: AC | PRN

## 2016-01-03 NOTE — Telephone Encounter (Signed)
Does she needs a appointment first? Please review.  Thanks,  -Hanya Guerin

## 2016-01-03 NOTE — Telephone Encounter (Signed)
Pt contacted office for refill request on the following medications:  ALPRAZolam (XANAX) 0.5 MG tablet.  [facility]  [phone]/MW

## 2016-01-03 NOTE — Telephone Encounter (Signed)
Patient advised as directed below. Per patient she is scheduled to see Antony ContrasJenni in November. Per Antony ContrasJenni that's ok. If patient needs other refill before we see her, she is more than happy to do the refill to be enough until we see patient in November and call the Prescription in. She needs to pick up the first prescription first [facility]  Thanks,  -Kemani Heidel

## 2016-01-03 NOTE — Telephone Encounter (Signed)
Rx printed with a refill for now. She will need to schedule an appt with one of the providers at some point to establish care with them so they may start prescribing her medications for her.

## 2016-04-26 ENCOUNTER — Ambulatory Visit: Payer: Self-pay | Admitting: Physician Assistant
